# Patient Record
Sex: Female | Born: 1965 | Race: White | Hispanic: No | Marital: Married | Smoking: Never smoker
Health system: Southern US, Community
[De-identification: ages and names within clinical notes are randomized; demographics above are authoritative.]

## PROBLEM LIST (undated history)

## (undated) DIAGNOSIS — Z8619 Personal history of other infectious and parasitic diseases: Secondary | ICD-10-CM

## (undated) DIAGNOSIS — Z860100 Personal history of colon polyps, unspecified: Secondary | ICD-10-CM

## (undated) DIAGNOSIS — Z8679 Personal history of other diseases of the circulatory system: Secondary | ICD-10-CM

## (undated) HISTORY — DX: Personal history of colonic polyps: Z86.010

## (undated) HISTORY — DX: Personal history of other diseases of the circulatory system: Z86.79

## (undated) HISTORY — DX: Personal history of colon polyps, unspecified: Z86.0100

## (undated) HISTORY — DX: Personal history of other infectious and parasitic diseases: Z86.19

---
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
## 2001-04-01 HISTORY — PX: BREAST SURGERY: SHX581

## 2010-05-31 LAB — HM COLONOSCOPY: HM Colonoscopy: NORMAL

## 2010-07-01 LAB — HM PAP SMEAR: HM Pap smear: NORMAL

## 2011-07-23 ENCOUNTER — Ambulatory Visit (HOSPITAL_BASED_OUTPATIENT_CLINIC_OR_DEPARTMENT_OTHER)
Admission: RE | Admit: 2011-07-23 | Discharge: 2011-07-23 | Disposition: A | Discharge status: Home / Self Care | Payer: 59 | Source: Ambulatory Visit | Attending: Family | Admitting: Family

## 2011-07-23 ENCOUNTER — Ambulatory Visit (INDEPENDENT_AMBULATORY_CARE_PROVIDER_SITE_OTHER): Payer: 59 | Admitting: Family

## 2011-07-23 ENCOUNTER — Encounter: Payer: Self-pay | Admitting: Family

## 2011-07-23 VITALS — BP 120/76 | HR 69 | Temp 98.2°F | Resp 16 | Ht 62.5 in | Wt 180.0 lb

## 2011-07-23 DIAGNOSIS — M25561 Pain in right knee: Secondary | ICD-10-CM

## 2011-07-23 DIAGNOSIS — M25569 Pain in unspecified knee: Secondary | ICD-10-CM

## 2011-07-23 DIAGNOSIS — X58XXXA Exposure to other specified factors, initial encounter: Secondary | ICD-10-CM

## 2011-07-23 MED ORDER — DICLOFENAC SODIUM 1 % TD GEL: TRANSDERMAL | Status: AC

## 2011-07-23 NOTE — Progress Notes (Signed)
Subjective:    Patient ID: Sue Webb, female    DOB: 05/23/1965, 46 y.o.   MRN: [id number]  HPI  Sue Webb is a 46 yr old female who presents today to establish care.  Fell in the yard.  R knee is hurting.  Worse with sitting.  Stiff/sore. Fall 9 days ago.  She has been using aleve, tylenol with minimal improvement.  Has seen Dr. Tonna Corner [place].  Colon Polyps- last colo 2011- told that she should have 5 yr follow up.  Sees [facility].  Hx rheumatic fever as child- reports no residual heart problems.    Review of Systems  Constitutional: Negative for unexpected weight change.  HENT: Negative for hearing loss and rhinorrhea.   Eyes: Negative for visual disturbance.  Respiratory: Negative for shortness of breath.   Cardiovascular: Negative for chest pain and leg swelling.  Gastrointestinal: Negative for vomiting, diarrhea and constipation.  Genitourinary:       Last period was several years ago.  Musculoskeletal: Negative for myalgias.       R knee pain  Skin: Negative for rash.  Neurological: Negative for headaches.  Hematological: Does not bruise/bleed easily.  Psychiatric/Behavioral:       Denies depression/anxiety   Past Medical History  Diagnosis Date  . History of chicken pox   . History of colon polyps     [facility]  . History of rheumatic fever childhood    History   Social History  . Marital Status: Married    Spouse Name: N/A    Number of Children: 1  . Years of Education: N/A   Occupational History  .     Social History Main Topics  . Smoking status: Never Smoker   . Smokeless tobacco: Never Used  . Alcohol Use: No  . Drug Use: Not on file  . Sexually Active: Not on file   Other Topics Concern  . Not on file   Social History Narrative   Caffeine use:  3-4 drinks dailyRegular exercise:  3 x weeklyWorks in [employer].  Bachelors degreeOne daughter age for 25 years.     Past Surgical History    Procedure Date  . Breast surgery 2003    right breast biopsy--benign  . Tonsillectomy 1971    Family History  Problem Relation Age of Onset  . Cancer Mother     colon  . Hypertension Mother   . Hyperlipidemia Mother   . Cancer Father     lung  . Cirrhosis Father   . Alcohol abuse Father   . Hypertension Father   . Heart attack Father   . Cancer Maternal Aunt     colon  . Hyperlipidemia Paternal Aunt   . Cancer Maternal Grandmother     colon & ovarian  . Heart disease Cousin     heart transplant?  . Lymphoma Paternal Aunt     non-hodgkins    No Known Allergies  No current outpatient prescriptions on file prior to visit.    BP 120/76  Pulse 69  Temp(Src) 98.2 F (36.8 C) (Oral)  Resp 16  Ht 5' 2.5" (1.588 m)  Wt 180 lb (81.647 kg)  BMI 32.40 kg/m2  SpO2 97%  LMP 04/01/2008       Objective:   Physical Exam  Constitutional: She appears well-developed and well-nourished. No distress.  HENT:  Head: Normocephalic and atraumatic.  Eyes: No scleral icterus.  Neck: Normal range of motion. Neck supple.  Cardiovascular: Normal  rate and regular rhythm.   No murmur heard. Pulmonary/Chest: Effort normal and breath sounds normal. No respiratory distress. She has no wheezes. She has no rales. She exhibits no tenderness.  Abdominal: Soft. Bowel sounds are normal.  Psychiatric: She has a normal mood and affect. Her behavior is normal. Judgment and thought content normal.  MS: very slight swelling of right knee, no crepitus.  Full ROM of right knee.         Assessment & Plan:

## 2011-07-23 NOTE — Patient Instructions (Addendum)
Please complete your knee x-ray on the first floor.  Schedule a fasting physical at your convenience. Call if knee pain worsens or if no improvement in 1 week.  Welcome to [facility]!

## 2011-07-24 ENCOUNTER — Telehealth: Payer: Self-pay | Admitting: Family

## 2011-07-24 DIAGNOSIS — M25561 Pain in right knee: Secondary | ICD-10-CM | POA: Insufficient documentation

## 2011-07-24 NOTE — Assessment & Plan Note (Signed)
X-ray of right knee is obtained and is unremarkable. Trial of Voltaren Gel.  If no improvement in the coming weeks, consider referral to sports medicine for further eval/imaging.

## 2011-07-24 NOTE — Telephone Encounter (Signed)
Received medical records from Dr. Michele Rockers  P: [phone] F: [phone]

## 2011-07-29 ENCOUNTER — Encounter: Payer: Self-pay | Admitting: Family

## 2011-07-30 ENCOUNTER — Encounter: Payer: Self-pay | Admitting: Family

## 2011-07-30 ENCOUNTER — Other Ambulatory Visit: Payer: Self-pay | Admitting: Family

## 2011-07-30 ENCOUNTER — Ambulatory Visit (INDEPENDENT_AMBULATORY_CARE_PROVIDER_SITE_OTHER): Payer: 59 | Admitting: Family

## 2011-07-30 DIAGNOSIS — Z136 Encounter for screening for cardiovascular disorders: Secondary | ICD-10-CM

## 2011-07-30 DIAGNOSIS — L304 Erythema intertrigo: Secondary | ICD-10-CM | POA: Insufficient documentation

## 2011-07-30 DIAGNOSIS — L538 Other specified erythematous conditions: Secondary | ICD-10-CM

## 2011-07-30 DIAGNOSIS — M25569 Pain in unspecified knee: Secondary | ICD-10-CM

## 2011-07-30 DIAGNOSIS — Z0000 Encounter for general adult medical examination without abnormal findings: Secondary | ICD-10-CM | POA: Insufficient documentation

## 2011-07-30 DIAGNOSIS — M25561 Pain in right knee: Secondary | ICD-10-CM

## 2011-07-30 LAB — CBC WITH DIFFERENTIAL/PLATELET
Basophils Relative: 0 % (ref 0–1)
Eosinophils Absolute: 0.2 10*3/uL (ref 0.0–0.7)
Eosinophils Relative: 2 % (ref 0–5)
HCT: 43.1 % (ref 36.0–46.0)
Lymphs Abs: 2.1 10*3/uL (ref 0.7–4.0)
MCH: 28.4 pg (ref 26.0–34.0)
MCHC: 31.8 g/dL (ref 30.0–36.0)
Monocytes Relative: 7 % (ref 3–12)
WBC: 7.5 10*3/uL (ref 4.0–10.5)

## 2011-07-30 LAB — HEPATIC FUNCTION PANEL
ALT: 11 U/L (ref 0–35)
AST: 13 U/L (ref 0–37)
Albumin: 4.5 g/dL (ref 3.5–5.2)
Alkaline Phosphatase: 54 U/L (ref 39–117)
Total Protein: 6.6 g/dL (ref 6.0–8.3)

## 2011-07-30 LAB — BASIC METABOLIC PANEL WITH GFR
Chloride: 107 mEq/L (ref 96–112)
Creat: 0.8 mg/dL (ref 0.50–1.10)
GFR, Est African American: >89 mL/min
GFR, Est Non African American: 88 mL/min

## 2011-07-30 LAB — LIPID PANEL
Cholesterol: 208 mg/dL — ABNORMAL HIGH (ref 0–200)
LDL Cholesterol: 138 mg/dL — ABNORMAL HIGH (ref 0–99)
VLDL: 24 mg/dL (ref 0–40)

## 2011-07-30 MED ORDER — NYSTATIN 100000 UNIT/GM EX POWD: Freq: Two times a day (BID) | CUTANEOUS | Status: AC

## 2011-07-30 NOTE — Assessment & Plan Note (Signed)
Commended pt on her healthy diet and exercise. We discussed weight loss.  Immunizations reviewed and up to date.  Colo, mammo, pap up to date.  Order screening dexa due to early menopause. Obtain fasting laboratories.

## 2011-07-30 NOTE — Assessment & Plan Note (Signed)
Improved

## 2011-07-30 NOTE — Progress Notes (Signed)
Subjective:    Patient ID: Sue Webb, female    DOB: [birth year], 46 y.o.   MRN: [id number]  HPI  Ms.  Eshbach is a 46 yr old female who presents today for her complete physical.   She is up to date on her tetanus.  Reports that she had a normal colo in 2011.  She reports that this was done by digestive specialists.  Exercising regularly.  At least 3 times a week.  Diet is healthy.    Knee pain- improved.   Review of Systems  Constitutional:       She has lost 40 pounds in the last year with diet/exercise.  HENT: Negative for rhinorrhea.   Respiratory: Negative for cough.   Cardiovascular: Negative for chest pain.  Gastrointestinal: Negative for nausea and vomiting.  Genitourinary: Negative for dysuria.       Last period was in 2009.    Musculoskeletal:       Knee pain improved.   Psychiatric/Behavioral:       Denies depression/anxiety       Past Medical History  Diagnosis Date  . History of chicken pox   . History of colon polyps     [facility]  . History of rheumatic fever childhood    History   Social History  . Marital Status: Married    Spouse Name: N/A    Number of Children: 1  . Years of Education: N/A   Occupational History  .     Social History Main Topics  . Smoking status: Never Smoker   . Smokeless tobacco: Never Used  . Alcohol Use: No  . Drug Use: Not on file  . Sexually Active: Not on file   Other Topics Concern  . Not on file   Social History Narrative   Caffeine use:  3-4 drinks dailyRegular exercise:  3 x weeklyWorks in [employer].  Bachelors degreeOne daughter age for 25 years.     Past Surgical History  Procedure Date  . Breast surgery 2003    right breast biopsy--benign  . Tonsillectomy 1971    Family History  Problem Relation Age of Onset  . Cancer Mother     colon  . Hypertension Mother   . Hyperlipidemia Mother   . Cancer Father     lung  . Cirrhosis Father   . Alcohol abuse Father   .  Hypertension Father   . Heart attack Father     age 57  . Cancer Maternal Aunt     colon  . Hyperlipidemia Paternal Aunt   . Cancer Maternal Grandmother     colon & ovarian  . Heart disease Cousin     heart transplant?  . Lymphoma Paternal Aunt     non-hodgkins    No Known Allergies  Current Outpatient Prescriptions on File Prior to Visit  Medication Sig Dispense Refill  . Calcium Carbonate-Vitamin D (CALTRATE 600+D) 600-400 MG-UNIT per tablet Take 1 tablet by mouth 2 (two) times daily.      . diclofenac sodium (VOLTAREN) 1 % GEL Apply 4 grams three times daily as needed to right knee.  100 g  1    BP 96/70  Pulse 59  Temp(Src) 97.9 F (36.6 C) (Oral)  Resp 16  Ht 5' 2.5" (1.588 m)  Wt 178 lb 0.6 oz (80.758 kg)  BMI 32.04 kg/m2  SpO2 95%  LMP 04/01/2008 Physical Exam  Constitutional: She is oriented to person, place, and time. She appears well-developed and  well-nourished. No distress.  HENT:  Head: Normocephalic and atraumatic.  Right Ear: Tympanic membrane and ear canal normal.  Left Ear: Tympanic membrane and ear canal normal.  Mouth/Throat: Oropharynx is clear and moist.  Eyes: Pupils are equal, round, and reactive to light. No scleral icterus.  Neck: Normal range of motion. No thyromegaly present.  Cardiovascular: Normal rate and regular rhythm.   No murmur heard. Pulmonary/Chest: Effort normal and breath sounds normal. No respiratory distress. He has no wheezes. She has no rales. She exhibits no tenderness.  Abdominal: Soft. Bowel sounds are normal. He exhibits no distension and no mass. There is no tenderness. There is no rebound and no guarding.  Musculoskeletal: She exhibits no edema.  Lymphadenopathy:    She has no cervical adenopathy.  Neurological: She is alert and oriented to person, place, and time. She has normal reflexes. She exhibits normal muscle tone. Coordination normal.  Skin: Skin is warm and dry. Fungal rash noted beneath breasts.  Psychiatric:  She has a normal mood and affect. Her behavior is normal. Judgment and thought content normal.  Breasts: Examined lying Right: Without masses, retractions, discharge or axillary adenopathy.  Left: Without masses, retractions, discharge or axillary adenopathy.        Assessment & Plan:      Objective:   Physical Exam        Assessment & Plan:

## 2011-07-30 NOTE — Assessment & Plan Note (Signed)
Will rx with nystatin.  

## 2011-07-30 NOTE — Patient Instructions (Signed)
Please complete your lab work prior to leaving. Schedule bone density at the front desk. Follow up as needed.

## 2011-07-31 ENCOUNTER — Encounter: Payer: Self-pay | Admitting: Family

## 2011-07-31 LAB — URINALYSIS, ROUTINE W REFLEX MICROSCOPIC
Bilirubin Urine: NEGATIVE
Leukocytes, UA: NEGATIVE
Nitrite: NEGATIVE
Specific Gravity, Urine: 1.022 (ref 1.005–1.030)
Urobilinogen, UA: 0.2 mg/dL (ref 0.0–1.0)

## 2011-07-31 LAB — HEMOGLOBIN A1C: Mean Plasma Glucose: 117 mg/dL — ABNORMAL HIGH (ref <117)

## 2011-07-31 LAB — VITAMIN D 25 HYDROXY (VIT D DEFICIENCY, FRACTURES): Vit D, 25-Hydroxy: 40 ng/mL (ref 30–89)

## 2011-07-31 LAB — URINALYSIS, MICROSCOPIC ONLY

## 2011-08-02 ENCOUNTER — Telehealth: Payer: Self-pay | Admitting: Family

## 2011-08-02 NOTE — Telephone Encounter (Signed)
Left message on cell# to return my call. 

## 2011-08-02 NOTE — Telephone Encounter (Signed)
Notified pt. 

## 2011-08-02 NOTE — Telephone Encounter (Signed)
pls call pt and let her know that I reviewed her labs and her fastin sugar was slightly elevated, but not in diabetic range. Also, cholesterol slightly elevated. She should continue diet, exercise and weight loss. Thyroid, vitamin d, liver, blood count kidneys all look good.

## 2011-08-16 ENCOUNTER — Inpatient Hospital Stay: Admission: RE | Admit: 2011-08-16 | Payer: 59 | Source: Ambulatory Visit

## 2011-09-09 ENCOUNTER — Encounter: Payer: Self-pay | Admitting: Family

## 2013-10-07 IMAGING — CR DG KNEE COMPLETE 4+V*R*
4 series · 4 of 4 positions shown · non-contrast
Comparison: None.

CLINICAL DATA: Trauma 1 week ago.  Pain.

RIGHT KNEE - COMPLETE 4+ VIEW

[t knee ap right]
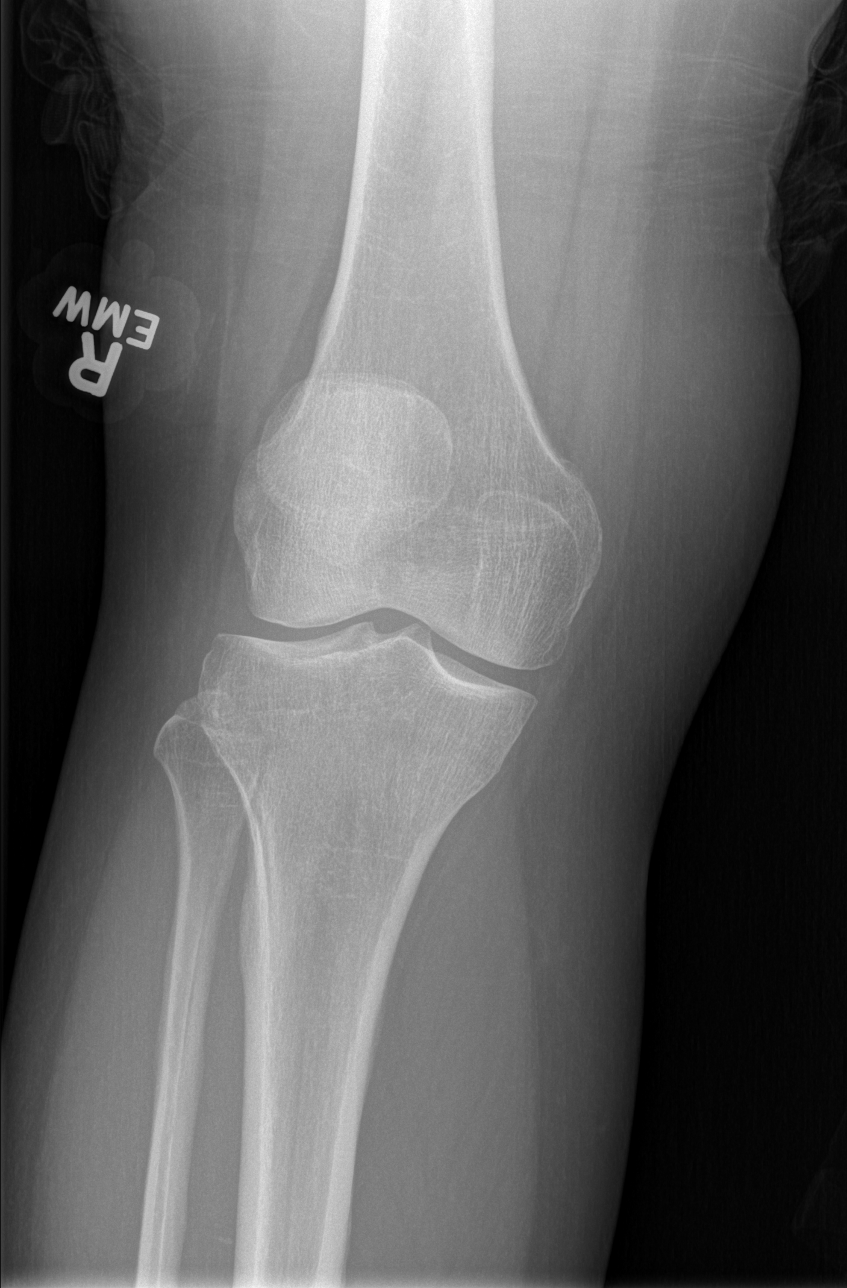

[t knee oblique right (1 of 2)]
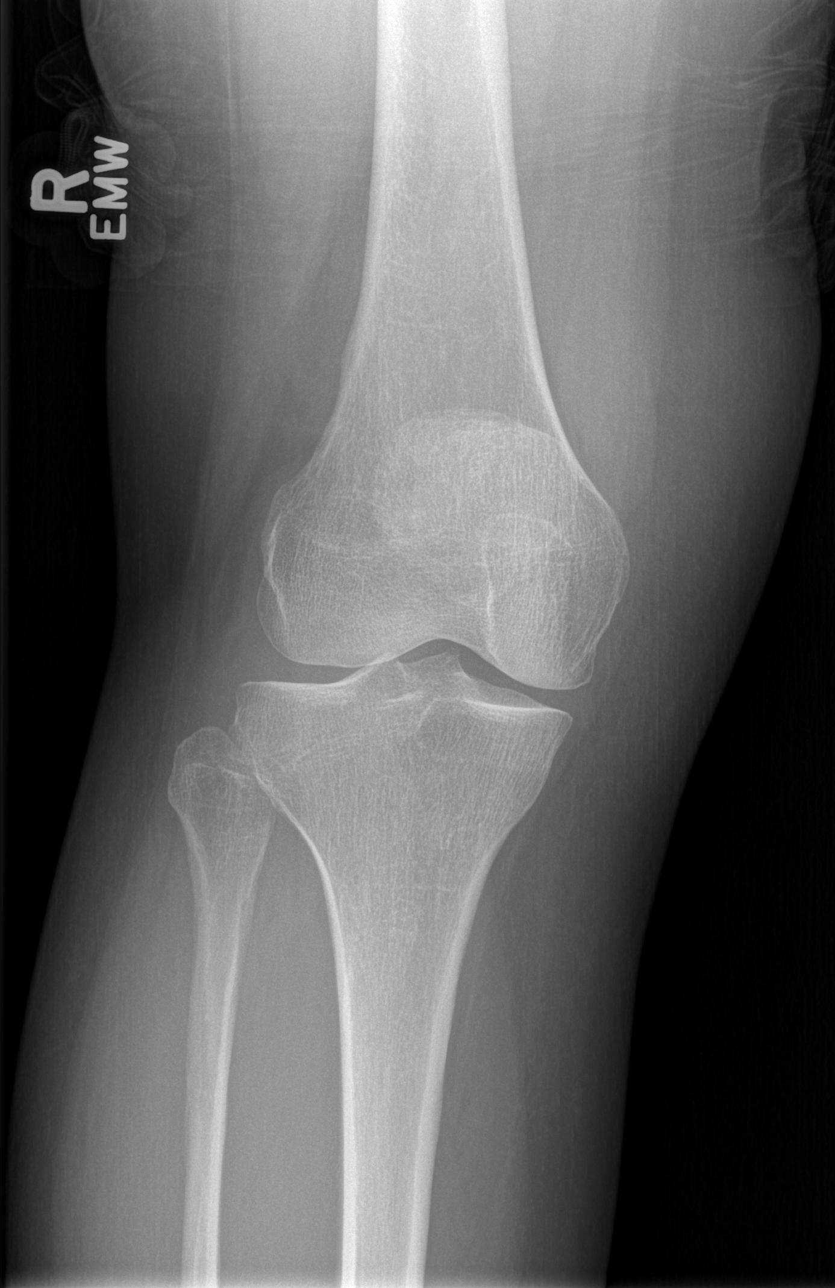

[t knee oblique right (2 of 2)]
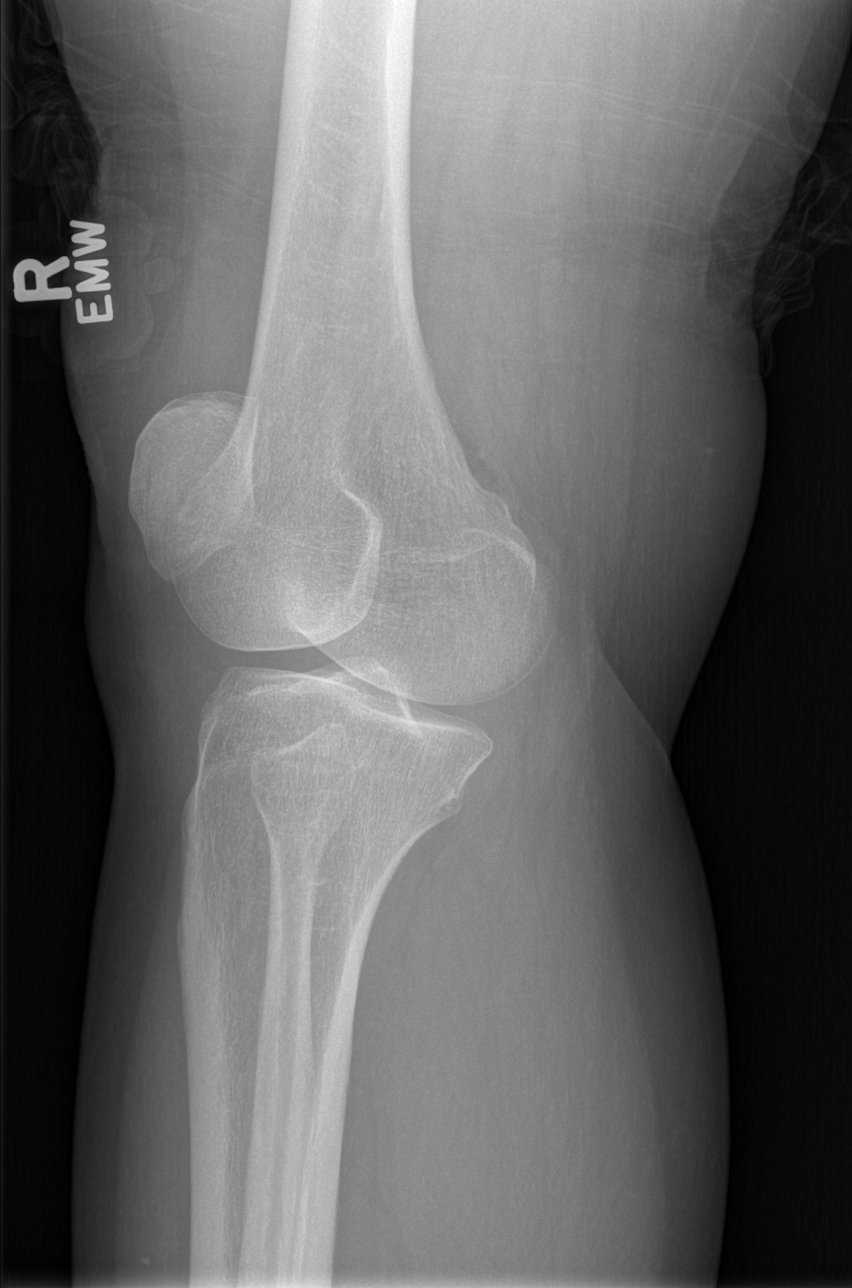

[t knee lat right]
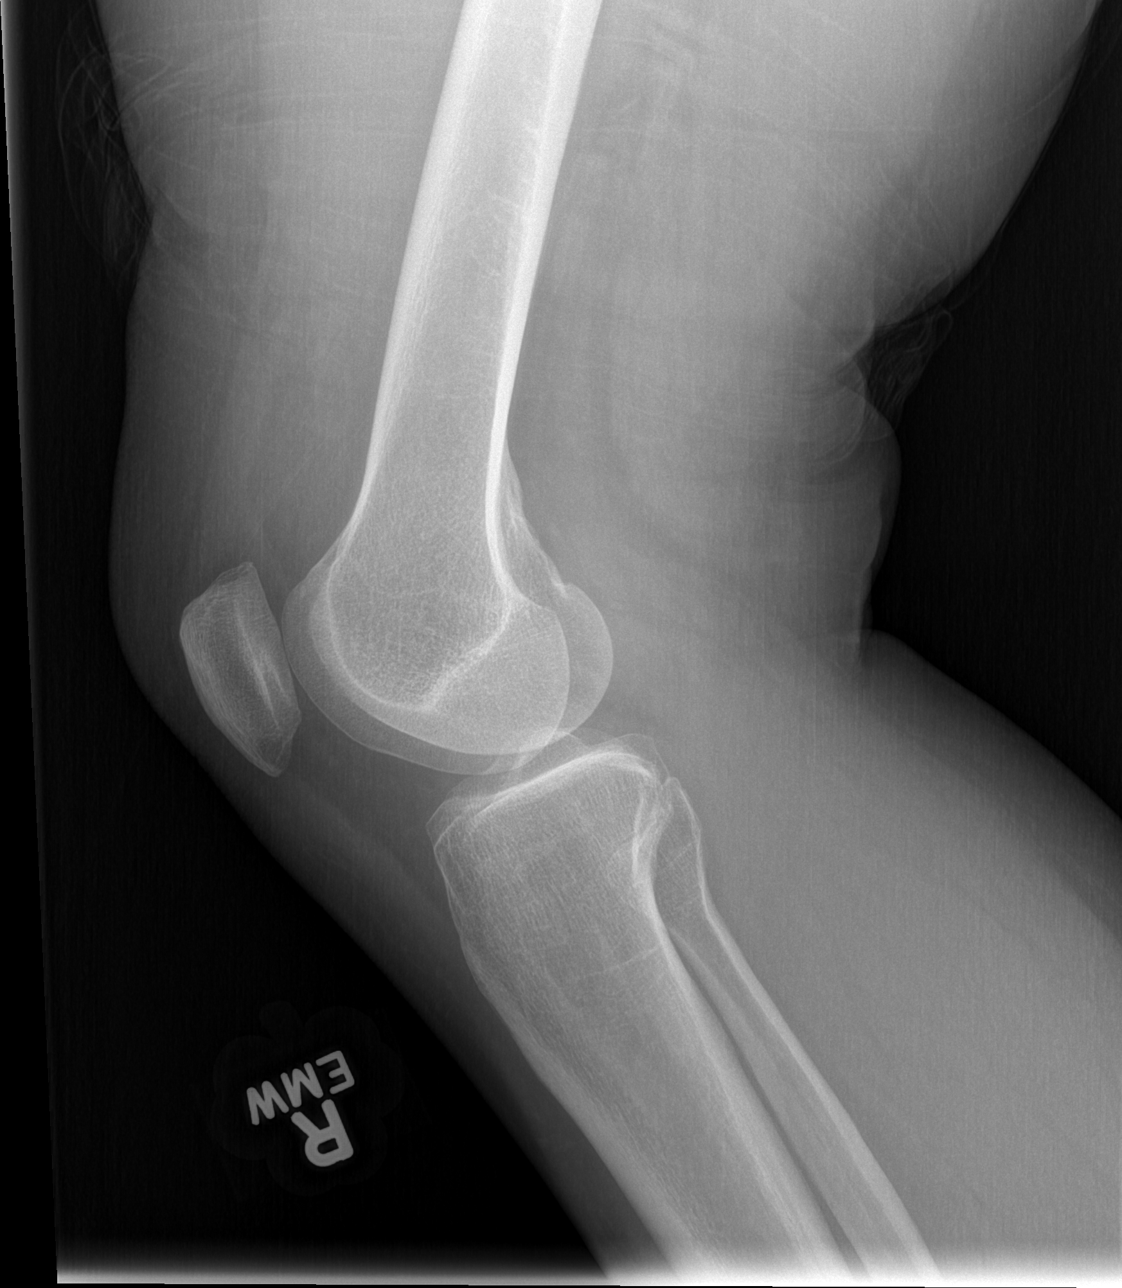

[4 of 4 positions shown; findings below may reference images not displayed]

FINDINGS: No fracture or dislocation.
IMPRESSION: No fracture.

## 2020-08-08 ENCOUNTER — Encounter: Payer: Self-pay | Admitting: Internal Medicine

## 2020-08-08 NOTE — Progress Notes (Signed)
This encounter was created in error - please disregard.

## 2020-08-08 NOTE — Progress Notes (Signed)
   NURSING HOME LOCATION:  [facility] ROOM NUMBER:    CODE STATUS:    PCP:    This is a nursing facility follow up visit of chronic medical diagnoses & to document compliance with Regulation 483.30 (c) in The Long Term Care Survey Manual Phase 2 which mandates caregiver visit ( visits can alternate among physician, PA or NP as per statutes) within 10 days of 30 days / 60 days/ 90 days post admission to SNF date    Interim medical record and care since last SNF visit was updated with review of diagnostic studies and change in clinical status since last visit were documented.  HPI:  Review of systems: Dementia invalidated responses. Date given as   Constitutional: No fever, significant weight change, fatigue  Eyes: No redness, discharge, pain, vision change ENT/mouth: No nasal congestion,  purulent discharge, earache, change in hearing, sore throat  Cardiovascular: No chest pain, palpitations, paroxysmal nocturnal dyspnea, claudication, edema  Respiratory: No cough, sputum production, hemoptysis, DOE, significant snoring, apnea   Gastrointestinal: No heartburn, dysphagia, abdominal pain, nausea /vomiting, rectal bleeding, melena, change in bowels Genitourinary: No dysuria, hematuria, pyuria, incontinence, nocturia Musculoskeletal: No joint stiffness, joint swelling, weakness, pain Dermatologic: No rash, pruritus, change in appearance of skin Neurologic: No dizziness, headache, syncope, seizures, numbness, tingling Psychiatric: No significant anxiety, depression, insomnia, anorexia Endocrine: No change in hair/skin/nails, excessive thirst, excessive hunger, excessive urination  Hematologic/lymphatic: No significant bruising, lymphadenopathy, abnormal bleeding Allergy/immunology: No itchy/watery eyes, significant sneezing, urticaria, angioedema  Physical exam:  Pertinent or positive findings: General appearance: Adequately nourished; no acute distress, increased  work of breathing is present.   Lymphatic: No lymphadenopathy about the head, neck, axilla. Eyes: No conjunctival inflammation or lid edema is present. There is no scleral icterus. Ears:  External ear exam shows no significant lesions or deformities.   Nose:  External nasal examination shows no deformity or inflammation. Nasal mucosa are pink and moist without lesions, exudates Oral exam:  Lips and gums are healthy appearing. There is no oropharyngeal erythema or exudate. Neck:  No thyromegaly, masses, tenderness noted.    Heart:  Normal rate and regular rhythm. S1 and S2 normal without gallop, murmur, click, rub .  Lungs: Chest clear to auscultation without wheezes, rhonchi, rales, rubs. Abdomen: Bowel sounds are normal. Abdomen is soft and nontender with no organomegaly, hernias, masses. GU: Deferred  Extremities:  No cyanosis, clubbing, edema  Neurologic exam : Cn 2-7 intact Strength equal  in upper & lower extremities Balance, Rhomberg, finger to nose testing could not be completed due to clinical state Deep tendon reflexes are equal Skin: Warm & dry w/o tenting. No significant lesions or rash.  See summary under each active problem in the Problem List with associated updated therapeutic plan
